# Patient Record
Sex: Male | Born: 1990 | Race: White | Hispanic: No | Marital: Single | Smoking: Current every day smoker
Health system: Southern US, Community
[De-identification: ages and names within clinical notes are randomized; demographics above are authoritative.]

## PROBLEM LIST (undated history)

## (undated) ENCOUNTER — Emergency Department (HOSPITAL_BASED_OUTPATIENT_CLINIC_OR_DEPARTMENT_OTHER): Admission: EM | Payer: 59

## (undated) HISTORY — PX: KNEE ARTHROSCOPY W/ ACL RECONSTRUCTION: SHX1858

---
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
## 2016-12-20 DIAGNOSIS — R0602 Shortness of breath: Secondary | ICD-10-CM | POA: Diagnosis not present

## 2016-12-20 DIAGNOSIS — J984 Other disorders of lung: Secondary | ICD-10-CM | POA: Diagnosis not present

## 2016-12-20 DIAGNOSIS — Z7689 Persons encountering health services in other specified circumstances: Secondary | ICD-10-CM | POA: Diagnosis not present

## 2016-12-20 DIAGNOSIS — R918 Other nonspecific abnormal finding of lung field: Secondary | ICD-10-CM | POA: Diagnosis not present

## 2016-12-22 DIAGNOSIS — J029 Acute pharyngitis, unspecified: Secondary | ICD-10-CM | POA: Diagnosis not present

## 2016-12-22 DIAGNOSIS — J351 Hypertrophy of tonsils: Secondary | ICD-10-CM | POA: Diagnosis not present

## 2016-12-24 DIAGNOSIS — J3501 Chronic tonsillitis: Secondary | ICD-10-CM | POA: Diagnosis not present

## 2017-04-09 DIAGNOSIS — J029 Acute pharyngitis, unspecified: Secondary | ICD-10-CM | POA: Diagnosis not present

## 2017-04-23 ENCOUNTER — Encounter: Payer: Self-pay | Admitting: Emergency Medicine

## 2017-04-23 ENCOUNTER — Emergency Department
Admission: EM | Admit: 2017-04-23 | Discharge: 2017-04-23 | Disposition: A | Discharge status: Home / Self Care | Payer: Commercial Managed Care - HMO | Attending: Emergency Medicine | Admitting: Emergency Medicine

## 2017-04-23 ENCOUNTER — Emergency Department: Payer: Commercial Managed Care - HMO

## 2017-04-23 DIAGNOSIS — Z87891 Personal history of nicotine dependence: Secondary | ICD-10-CM | POA: Diagnosis not present

## 2017-04-23 DIAGNOSIS — R55 Syncope and collapse: Secondary | ICD-10-CM | POA: Diagnosis not present

## 2017-04-23 DIAGNOSIS — R42 Dizziness and giddiness: Secondary | ICD-10-CM | POA: Diagnosis not present

## 2017-04-23 DIAGNOSIS — R0602 Shortness of breath: Secondary | ICD-10-CM | POA: Diagnosis not present

## 2017-04-23 DIAGNOSIS — J019 Acute sinusitis, unspecified: Secondary | ICD-10-CM | POA: Diagnosis not present

## 2017-04-23 LAB — BASIC METABOLIC PANEL
ANION GAP: 7 (ref 5–15)
BUN: 13 mg/dL (ref 6–20)
CHLORIDE: 102 mmol/L (ref 101–111)
CO2: 29 mmol/L (ref 22–32)
Calcium: 9.1 mg/dL (ref 8.9–10.3)
Creatinine, Ser: 0.97 mg/dL (ref 0.61–1.24)
GFR calc non Af Amer: >60 mL/min (ref >60)
GLUCOSE: 110 mg/dL — AB (ref 65–99)
POTASSIUM: 3.3 mmol/L — AB (ref 3.5–5.1)
Sodium: 138 mmol/L (ref 135–145)

## 2017-04-23 LAB — TROPONIN I: Troponin I: <0.03 ng/mL (ref <0.03)

## 2017-04-23 LAB — CBC
HEMATOCRIT: 38.7 % — AB (ref 40.0–52.0)
HEMOGLOBIN: 13.8 g/dL (ref 13.0–18.0)
MCH: 27.8 pg (ref 26.0–34.0)
MCHC: 35.8 g/dL (ref 32.0–36.0)
MCV: 77.7 fL — AB (ref 80.0–100.0)
Platelets: 296 10*3/uL (ref 150–440)
RBC: 4.98 MIL/uL (ref 4.40–5.90)
RDW: 12.9 % (ref 11.5–14.5)
WBC: 10.6 10*3/uL (ref 3.8–10.6)

## 2017-04-23 LAB — CK: Total CK: 114 U/L (ref 49–397)

## 2017-04-23 MED ORDER — IOPAMIDOL (ISOVUE-370) INJECTION 76%
75.0000 mL | Freq: Once | INTRAVENOUS | Status: AC | PRN
  Administered 2017-04-23: 75 mL via INTRAVENOUS

## 2017-04-23 MED ORDER — AZITHROMYCIN 500 MG PO TABS
500.0000 mg | ORAL_TABLET | Freq: Once | ORAL | Status: AC
  Administered 2017-04-23: 500 mg via ORAL
  Filled 2017-04-23: qty 1

## 2017-04-23 MED ORDER — SODIUM CHLORIDE 0.9 % IV BOLUS (SEPSIS)
1000.0000 mL | Freq: Once | INTRAVENOUS | Status: AC
  Administered 2017-04-23: 1000 mL via INTRAVENOUS

## 2017-04-23 MED ORDER — AZITHROMYCIN 250 MG PO TABS: 250.0000 mg | ORAL_TABLET | Freq: Every day | ORAL | 0 refills | Status: AC

## 2017-04-23 NOTE — ED Triage Notes (Signed)
Pt presents to ED with c/o having a near syncopal episode while driving home just prior to arrival. Pt states he had a sudden onset of feeling dizzy and felt like his hands were going numb with sob. Pt currently talkative and answering questions without difficulty. Pt has no increased work of breathing noted at this time. denies any change in his eating habits and states he works outside but drank plenty of water today. Pt reports he stopped drinking red bull canned drinks about a month ago due to having chest pain in the center of his chest after drinking them. That pain has continued and increases when "breathing".

## 2017-04-23 NOTE — ED Notes (Signed)
Patient transported to CT 

## 2017-04-23 NOTE — ED Provider Notes (Signed)
[facility] Emergency Department Provider Note   ____________________________________________   First MD Initiated Contact with Patient 04/23/17 [id number]     (approximate)  I have reviewed the triage vital signs and the nursing notes.   HISTORY  Chief Complaint Dizziness and Near Syncope    HPI Albert Ferguson is a 26 y.o. male who presents to the ED with a chief complaint of dizziness. Patient was driving to his girlfriend's house when he turned to look to the left. Upon turning his head back towards the road, patient experienced sudden-onsetdizziness like the room was spinning. Symptoms associated with right upper extremity numbness and tingling. Felt like his right hand was clumsy. Denies associated headache, vision changes, slurred speech, altered mentation. 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 Denies recent fever, chills, chest pain, shortness of breath, abdominal pain, nausea, vomiting. Denies recent travel. Without intervention, symptoms have completely resolved.   Past medical history None  There are no active problems to display for this patient.   Past Surgical History:  Procedure Laterality Date  . KNEE ARTHROSCOPY W/ ACL RECONSTRUCTION      Prior to Admission medications   Not on File    Allergies Patient has no known allergies.  No family history on file.  Social History Social History  Substance Use Topics  . Smoking status: Former Games developer  . Smokeless tobacco: Never Used  . Alcohol use No    Review of Systems  Constitutional: No fever/chills. Eyes: No visual changes. ENT: No sore throat. Cardiovascular: Denies chest pain. Respiratory: Denies shortness of breath. Gastrointestinal: No abdominal pain.  No nausea, no vomiting.  No diarrhea.  No constipation. Genitourinary: Negative for dysuria. Musculoskeletal: Negative for back pain. Skin: Negative for  rash. Neurological: Negative for headaches or focal weakness. positive for dizziness and right upper extremity numbness/tingling.   ____________________________________________   PHYSICAL EXAM:  VITAL SIGNS: ED Triage Vitals  Enc Vitals Group     BP 04/23/17 0028 137/85     Pulse Rate 04/23/17 0028 74     Resp 04/23/17 0028 18     Temp 04/23/17 0028 98.4 F (36.9 C)     Temp Source 04/23/17 0028 Oral     SpO2 04/23/17 0028 99 %     Weight 04/23/17 0029 156 lb (70.8 kg)     Height 04/23/17 0029  (1.778 m)     Head Circumference --      Peak Flow --      Pain Score 04/23/17 0027 2     Pain Loc --      Pain Edu? --      Excl. in GC? --     Constitutional: Alert and oriented. Well appearing and in no acute distress. Eyes: Conjunctivae are normal. PERRL. EOMI. Head: Atraumatic. Nose: No congestion/rhinnorhea. Mouth/Throat: Mucous membranes are moist.  Oropharynx non-erythematous. Neck: No stridor.  No carotid bruits. Cardiovascular: Normal rate, regular rhythm. Grossly normal heart sounds.  Good peripheral circulation. Respiratory: Normal respiratory effort.  No retractions. Lungs CTAB. Gastrointestinal: Soft and nontender. No distention. No abdominal bruits. No CVA tenderness. Musculoskeletal: No lower extremity tenderness nor edema.  No joint effusions. Neurologic:  Alert and oriented 3. CN II-XII grossly intact. Normal speech and language. No gross focal neurologic deficits are appreciated. No gait instability. Skin:  Skin is warm, dry and intact. No rash noted. Psychiatric: Mood and affect are normal. Speech and behavior are normal.  ____________________________________________   LABS (all labs ordered are listed, but only abnormal results are displayed)  Labs Reviewed  BASIC METABOLIC PANEL - Abnormal; Notable for the following:       Result Value   Potassium 3.3 (*)    Glucose, Bld 110 (*)    All other components within normal limits  CBC - Abnormal;  Notable for the following:    HCT 38.7 (*)    MCV 77.7 (*)    All other components within normal limits  TROPONIN I  TROPONIN I  CK   ____________________________________________  EKG  ED ECG REPORT I, Ceferino Lang J, the attending physician, personally viewed and interpreted this ECG.   Date: 04/23/2017  EKG Time: 0028  Rate: 72  Rhythm: normal EKG, normal sinus rhythm  Axis: RAD  Intervals:none  ST&T Change: nonspecific  ____________________________________________  RADIOLOGY  Ct Angio Head W/cm &/or Wo Cm  Result Date: 04/23/2017 CLINICAL DATA:  Near syncopal episode EXAM: CT ANGIOGRAPHY HEAD AND NECK TECHNIQUE: Multidetector CT imaging of the head and neck was performed using the standard protocol during bolus administration of intravenous contrast. Multiplanar CT image reconstructions and MIPs were obtained to evaluate the vascular anatomy. Carotid stenosis measurements (when applicable) are obtained utilizing NASCET criteria, using the distal internal carotid diameter as the denominator. CONTRAST:  75 mL Isovue 370 COMPARISON:  None. FINDINGS: CT HEAD FINDINGS Brain: No mass lesion, intraparenchymal hemorrhage or extra-axial collection. No evidence of acute cortical infarct. Brain parenchyma and CSF-containing spaces are normal for age. Vascular: No hyperdense vessel or unexpected calcification. Skull: Normal visualized skull base, calvarium and extracranial soft tissues. Sinuses/Orbits: Moderate maxillary and ethmoid sinus mucosal thickening. Normal orbits. CTA NECK FINDINGS Aortic arch: There is no aneurysm or dissection of the visualized ascending aorta or aortic arch. Normal 3 vessel aortic branching pattern. The visualized proximal subclavian arteries are normal. Right carotid system: The right common carotid origin is widely patent. There is no common carotid or internal carotid artery dissection or aneurysm. No hemodynamically significant stenosis. Left carotid system: The left  common carotid origin is widely patent. There is no common carotid or internal carotid artery dissection or aneurysm. No hemodynamically significant stenosis. Vertebral arteries: The vertebral system is left dominant. Both vertebral artery origins are normal. Both vertebral arteries are normal to their confluence with the basilar artery. Skeleton: There is no bony spinal canal stenosis. No lytic or blastic lesions. Other neck: The nasopharynx is clear. The oropharynx and hypopharynx are normal. The epiglottis is normal. The supraglottic larynx, glottis and subglottic larynx are normal. No retropharyngeal collection. The parapharyngeal spaces are preserved. The parotid and submandibular glands are normal. No sialolithiasis or salivary ductal dilatation. The thyroid gland is normal. There is no cervical lymphadenopathy. Upper chest: No pneumothorax or pleural effusion. No nodules or masses. Review of the MIP images confirms the above findings CTA HEAD FINDINGS Anterior circulation: --Intracranial internal carotid arteries: Normal. --Anterior cerebral arteries: Normal. --Middle cerebral arteries: Normal. --Posterior communicating arteries: Absent bilaterally. Posterior circulation: --Posterior cerebral arteries: Normal. --Superior cerebellar arteries: Normal. --Basilar artery: Normal. --Anterior inferior cerebellar arteries: Normal. --Posterior inferior cerebellar arteries: Normal. Venous sinuses: As permitted by contrast timing, patent. Anatomic variants: None Delayed phase: No parenchymal contrast enhancement. Review of the MIP images confirms the above findings IMPRESSION: 1. Normal CTA of the head and neck. 2. Moderate bilateral ethmoid and maxillary sinus mucosal thickening. Electronically Signed   By: Deatra Robinson M.D.   On: 04/23/2017 04:52   Dg Chest 2 View  Result Date: 04/23/2017 CLINICAL DATA:  Sudden onset dizziness. Shortness of breath. Hands going numb. Previous smoker. EXAM: CHEST  2 VIEW  COMPARISON:  None. FINDINGS: Mild hyperinflation. The heart size and mediastinal contours are within normal limits. Both lungs are clear. The visualized skeletal structures are unremarkable. IMPRESSION: No active cardiopulmonary disease. Electronically Signed   By: Burman Nieves M.D.   On: 04/23/2017 00:57   Ct Angio Neck W And/or Wo Contrast  Result Date: 04/23/2017 CLINICAL DATA:  Near syncopal episode EXAM: CT ANGIOGRAPHY HEAD AND NECK TECHNIQUE: Multidetector CT imaging of the head and neck was performed using the standard protocol during bolus administration of intravenous contrast. Multiplanar CT image reconstructions and MIPs were obtained to evaluate the vascular anatomy. Carotid stenosis measurements (when applicable) are obtained utilizing NASCET criteria, using the distal internal carotid diameter as the denominator. CONTRAST:  75 mL Isovue 370 COMPARISON:  None. FINDINGS: CT HEAD FINDINGS Brain: No mass lesion, intraparenchymal hemorrhage or extra-axial collection. No evidence of acute cortical infarct. Brain parenchyma and CSF-containing spaces are normal for age. Vascular: No hyperdense vessel or unexpected calcification. Skull: Normal visualized skull base, calvarium and extracranial soft tissues. Sinuses/Orbits: Moderate maxillary and ethmoid sinus mucosal thickening. Normal orbits. CTA NECK FINDINGS Aortic arch: There is no aneurysm or dissection of the visualized ascending aorta or aortic arch. Normal 3 vessel aortic branching pattern. The visualized proximal subclavian arteries are normal. Right carotid system: The right common carotid origin is widely patent. There is no common carotid or internal carotid artery dissection or aneurysm. No hemodynamically significant stenosis. Left carotid system: The left common carotid origin is widely patent. There is no common carotid or internal carotid artery dissection or aneurysm. No hemodynamically significant stenosis. Vertebral arteries: The  vertebral system is left dominant. Both vertebral artery origins are normal. Both vertebral arteries are normal to their confluence with the basilar artery. Skeleton: There is no bony spinal canal stenosis. No lytic or blastic lesions. Other neck: The nasopharynx is clear. The oropharynx and hypopharynx are normal. The epiglottis is normal. The supraglottic larynx, glottis and subglottic larynx are normal. No retropharyngeal collection. The parapharyngeal spaces are preserved. The parotid and submandibular glands are normal. No sialolithiasis or salivary ductal dilatation. The thyroid gland is normal. There is no cervical lymphadenopathy. Upper chest: No pneumothorax or pleural effusion. No nodules or masses. Review of the MIP images confirms the above findings CTA HEAD FINDINGS Anterior circulation: --Intracranial internal carotid arteries: Normal. --Anterior cerebral arteries: Normal. --Middle cerebral arteries: Normal. --Posterior communicating arteries: Absent bilaterally. Posterior circulation: --Posterior cerebral arteries: Normal. --Superior cerebellar arteries: Normal. --Basilar artery: Normal. --Anterior inferior cerebellar arteries: Normal. --Posterior inferior cerebellar arteries: Normal. Venous sinuses: As permitted by contrast timing, patent. Anatomic variants: None Delayed phase: No parenchymal contrast enhancement. Review of the MIP images confirms the above findings IMPRESSION: 1. Normal CTA of the head and neck. 2. Moderate bilateral ethmoid and maxillary sinus mucosal thickening. Electronically Signed   By: Deatra Robinson M.D.   On: 04/23/2017 04:52    ____________________________________________   PROCEDURES  Procedure(s) performed: None   NIH Stroke Scale  Interval: Current assessment Time: 5:45 AM Person Administering Scale: Reed Dady J  Administer stroke scale items in the order listed. Record performance in each category after each subscale exam. Do not go back and change  scores. Follow directions provided for each exam technique. Scores should reflect what the patient does, not what the clinician thinks the patient can do. The clinician should record answers while administering  the exam and work quickly. Except where indicated, the patient should not be coached (i.e., repeated requests to patient to make a special effort).   1a  Level of consciousness: 0=alert; keenly responsive  1b. LOC questions:  0=Performs both tasks correctly  1c. LOC commands: 0=Performs both tasks correctly  2.  Best Gaze: 0=normal  3.  Visual: 0=No visual loss  4. Facial Palsy: 0=Normal symmetric movement  5a.  Motor left arm: 0=No drift, limb holds 90 (or 45) degrees for full 10 seconds  5b.  Motor right arm: 0=No drift, limb holds 90 (or 45) degrees for full 10 seconds  6a. motor left leg: 0=No drift, limb holds 90 (or 45) degrees for full 10 seconds  6b  Motor right leg:  0=No drift, limb holds 90 (or 45) degrees for full 10 seconds  7. Limb Ataxia: 0=Absent  8.  Sensory: 0=Normal; no sensory loss  9. Best Language:  0=No aphasia, normal  10. Dysarthria: 0=Normal  11. Extinction and Inattention: 0=No abnormality  12. Distal motor function: 0=Normal   Total:   0    Procedures  Critical Care performed: No  ____________________________________________   INITIAL IMPRESSION / ASSESSMENT AND PLAN / ED COURSE  Pertinent labs & imaging results that were available during my care of the patient were reviewed by me and considered in my medical decision making (see chart for details).  26 year old otherwise healthy male who presents with sudden onset dizziness with RUE numbness/tingling. NIH scale = 0. Differential diagnosis includes TIA, CVA, carotid dissection, acute coronary event. Initial troponin unremarkable. 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 Will obtain CTA head and neck to evaluate for vascular abnormality.  Clinical Course as of  Apr 23 545  Thu Apr 23, 2017  0543 Updated patient and mother of CT imaging results as well as CK. Patient is resting in no acute distress. Symptoms have not returned. Queried patient regarding recent or active sinus infection. Patient states he has been experiencing sinus pressure and congestion for the past month, worse this week. Will place on short course of azithromycin. Strict return precautions given. Both verbalize understanding and agree with plan of care.  [JS]    Clinical Course User Index [JS] Irean Hong, MD     ____________________________________________   FINAL CLINICAL IMPRESSION(S) / ED DIAGNOSES  Final diagnoses:  Dizziness  Acute sinusitis, recurrence not specified, unspecified location      NEW MEDICATIONS STARTED DURING THIS VISIT:  New Prescriptions   No medications on file     Note:  This document was prepared using Dragon voice recognition software and may include unintentional dictation errors.    Irean Hong, MD 04/23/17 [phone]

## 2017-04-23 NOTE — Discharge Instructions (Signed)
1. Finish antibiotic as prescribed (azithromycin 250 mg daily 4 days). Start your next dose Friday. 2. Return to the ER for worsening symptoms, persistent vomiting, difficulty breathing or other concerns.

## 2017-04-23 NOTE — ED Notes (Signed)

## 2017-05-07 DIAGNOSIS — Z87891 Personal history of nicotine dependence: Secondary | ICD-10-CM | POA: Diagnosis not present

## 2017-05-07 DIAGNOSIS — H8113 Benign paroxysmal vertigo, bilateral: Secondary | ICD-10-CM | POA: Diagnosis not present

## 2017-05-07 DIAGNOSIS — Z9109 Other allergy status, other than to drugs and biological substances: Secondary | ICD-10-CM | POA: Diagnosis not present

## 2018-04-23 ENCOUNTER — Ambulatory Visit (INDEPENDENT_AMBULATORY_CARE_PROVIDER_SITE_OTHER): Payer: 59 | Admitting: Orthopaedic Surgery

## 2018-04-23 ENCOUNTER — Ambulatory Visit (INDEPENDENT_AMBULATORY_CARE_PROVIDER_SITE_OTHER): Payer: 59

## 2018-04-23 ENCOUNTER — Encounter (INDEPENDENT_AMBULATORY_CARE_PROVIDER_SITE_OTHER): Payer: Self-pay | Admitting: Orthopaedic Surgery

## 2018-04-23 VITALS — BP 140/80 | HR 78 | Ht 71.0 in | Wt 175.0 lb

## 2018-04-23 DIAGNOSIS — M542 Cervicalgia: Secondary | ICD-10-CM

## 2018-04-23 DIAGNOSIS — M25511 Pain in right shoulder: Secondary | ICD-10-CM

## 2018-04-23 NOTE — Progress Notes (Signed)
Office Visit Note   Patient: Albert Ferguson           Date of Birth: [birth year]           MRN: [id number] Visit Date: 04/23/2018              Requested by: [facility] [address] [place], [place] [address] PCP: [facility]   Assessment & Plan: Visit Diagnoses:  1. Right shoulder pain, unspecified chronicity   2. Neck pain     Plan: Conservative treatment recommended.  He can use ice, heat, Aleve 2 p.o. twice daily PRN.  If he has persistent symptoms after 6 weeks he can return.  He is neurologically intact on exam.  Follow-Up Instructions: Return if symptoms worsen or fail to improve.   Orders:  Orders Placed This Encounter  Procedures  . XR Shoulder Right  . XR Cervical Spine 2 or 3 views   No orders of the defined types were placed in this encounter.     Procedures: No procedures performed   Clinical Data: No additional findings.   Subjective: Chief Complaint  Patient presents with  . 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  He states usually can let go of the drill but was not able to let go quick enough.  A jerked and rotated his shoulder and he had soreness in his shoulder some difficulty with range of motion.  Sundays is been better than others.  He denies numbness or tingling in his hand.  After couple weeks he just had a second episode similar problem 2 days ago and again had some pain in his right shoulder.  No gait disturbance.  Past history of ACL reconstruction right knee 2012 which is doing well.  Review of Systems positive for previous right knee ACL reconstruction 2012 otherwise negative.  14 point review of systems updated.  One ER visit for dizziness with negative CT scan and diagnosis was sinus infection  treated with antibiotics.   Objective: Vital Signs: BP 140/80   Pulse 78   Ht 5\' 11"  (1.803 m)   Wt 175 lb (79.4 kg)   BMI 24.41 kg/m   Physical Exam  Constitutional: He is oriented to person, place, and time. He appears well-developed and well-nourished.  HENT:  Head: Normocephalic and atraumatic.  Eyes: Pupils are equal, round, and reactive to light. EOM are normal.  Neck: No tracheal deviation present. No thyromegaly present.  Cardiovascular: Normal rate.  Pulmonary/Chest: Effort normal. He has no wheezes.  Abdominal: Soft. Bowel sounds are normal.  Neurological: He is alert and oriented to person, place, and time.  Skin: Skin is warm and dry. Capillary refill takes less than 2 seconds.  Psychiatric: He has a normal mood and affect. His behavior is normal. Judgment and thought content normal.    Ortho Exam patient has negative Spurling negative Lhermitte, upper extremity reflexes 1+ and symmetrical no isolated motor weakness.  Full active range of motion of the shoulder negative subluxation.  Negative drop arm test negative Neer negative Hawkins long of the biceps is stable.  No atrophy sensation hand is normal good grip.  Normal heel toe gait.  Specialty Comments:  No specialty comments available.  Imaging: No results found.   PMFS  History: There are no active problems to display for this patient.  No past medical history on file.  No family history on file.  Past Surgical History:  Procedure Laterality Date  . KNEE ARTHROSCOPY W/ ACL RECONSTRUCTION     Social History   Occupational History  . Not on file  Tobacco Use  . Smoking status: Former Games developermoker  . Smokeless tobacco: Never Used  Substance and Sexual Activity  . Alcohol use: No  . Drug use: No  . Sexual activity: Not on file

## 2019-08-29 ENCOUNTER — Emergency Department (HOSPITAL_COMMUNITY)
Admission: EM | Admit: 2019-08-29 | Discharge: 2019-08-29 | Disposition: A | Discharge status: Home / Self Care | Payer: Self-pay | Attending: Emergency Medicine | Admitting: Emergency Medicine

## 2019-08-29 ENCOUNTER — Other Ambulatory Visit: Payer: Self-pay

## 2019-08-29 ENCOUNTER — Emergency Department (HOSPITAL_COMMUNITY): Payer: Self-pay

## 2019-08-29 ENCOUNTER — Encounter (HOSPITAL_COMMUNITY): Payer: Self-pay | Admitting: Emergency Medicine

## 2019-08-29 DIAGNOSIS — Z87891 Personal history of nicotine dependence: Secondary | ICD-10-CM | POA: Insufficient documentation

## 2019-08-29 DIAGNOSIS — R569 Unspecified convulsions: Secondary | ICD-10-CM | POA: Insufficient documentation

## 2019-08-29 LAB — URINALYSIS, ROUTINE W REFLEX MICROSCOPIC
Bilirubin Urine: NEGATIVE
Glucose, UA: NEGATIVE mg/dL
Hgb urine dipstick: NEGATIVE
Ketones, ur: NEGATIVE mg/dL
Leukocytes,Ua: NEGATIVE
Nitrite: NEGATIVE
Protein, ur: NEGATIVE mg/dL
Specific Gravity, Urine: 1.016 (ref 1.005–1.030)
pH: 5 (ref 5.0–8.0)

## 2019-08-29 LAB — CBC WITH DIFFERENTIAL/PLATELET
Abs Immature Granulocytes: 0.08 10*3/uL — ABNORMAL HIGH (ref 0.00–0.07)
Basophils Absolute: 0.1 10*3/uL (ref 0.0–0.1)
Basophils Relative: 1 %
Eosinophils Absolute: 0.3 10*3/uL (ref 0.0–0.5)
Eosinophils Relative: 4 %
HCT: 46.2 % (ref 39.0–52.0)
Hemoglobin: 15.3 g/dL (ref 13.0–17.0)
Immature Granulocytes: 1 %
Lymphocytes Relative: 22 %
Lymphs Abs: 1.6 10*3/uL (ref 0.7–4.0)
MCH: 27.8 pg (ref 26.0–34.0)
MCHC: 33.1 g/dL (ref 30.0–36.0)
MCV: 84 fL (ref 80.0–100.0)
Monocytes Absolute: 0.6 10*3/uL (ref 0.1–1.0)
Monocytes Relative: 8 %
Neutro Abs: 4.7 10*3/uL (ref 1.7–7.7)
Neutrophils Relative %: 64 %
Platelets: 244 10*3/uL (ref 150–400)
RBC: 5.5 MIL/uL (ref 4.22–5.81)
RDW: 13.2 % (ref 11.5–15.5)
WBC: 7.2 10*3/uL (ref 4.0–10.5)
nRBC: 0 % (ref 0.0–0.2)

## 2019-08-29 LAB — RAPID URINE DRUG SCREEN, HOSP PERFORMED
Amphetamines: NOT DETECTED
Barbiturates: NOT DETECTED
Benzodiazepines: NOT DETECTED
Cocaine: NOT DETECTED
Opiates: NOT DETECTED
Tetrahydrocannabinol: NOT DETECTED

## 2019-08-29 LAB — BASIC METABOLIC PANEL
Anion gap: 10 (ref 5–15)
BUN: 14 mg/dL (ref 6–20)
CO2: 25 mmol/L (ref 22–32)
Calcium: 9 mg/dL (ref 8.9–10.3)
Chloride: 103 mmol/L (ref 98–111)
Creatinine, Ser: 1.05 mg/dL (ref 0.61–1.24)
GFR calc Af Amer: >60 mL/min (ref >60)
GFR calc non Af Amer: >60 mL/min (ref >60)
Glucose, Bld: 105 mg/dL — ABNORMAL HIGH (ref 70–99)
Potassium: 3.8 mmol/L (ref 3.5–5.1)
Sodium: 138 mmol/L (ref 135–145)

## 2019-08-29 LAB — ETHANOL: Alcohol, Ethyl (B): <10 mg/dL (ref <10)

## 2019-08-29 LAB — CBG MONITORING, ED: Glucose-Capillary: 101 mg/dL — ABNORMAL HIGH (ref 70–99)

## 2019-08-29 MED ORDER — SODIUM CHLORIDE 0.9 % IV BOLUS
1000.0000 mL | Freq: Once | INTRAVENOUS | Status: AC
  Administered 2019-08-29: 1000 mL via INTRAVENOUS

## 2019-08-29 NOTE — ED Triage Notes (Addendum)
Pt BIB [facility] from home C/O seizure. Girlfriend reports pt was shaking and foam at the mouth while sleeping  Onset: 5:30 am while sleeping  Duration: 5 minutes  Postictal 10 minutes   Denies HX of seizures. Pt A&Ox4. No injuries. No loss of continence. Pt is unable to recall event. Pt denies any recent illness/stress. States he is a everyday drinker.   [facility] VS BP 150/90 HR 90 NSR SpO2 100% RA CBG 118

## 2019-08-29 NOTE — Discharge Instructions (Signed)
Call [facility] for further evaluation of your suspected new onset seizure.  Your work up today is normal but you will need further testing which the neurology specialist can provide.  In the interim, avoid any activity that could cause injury to you or others if you were to have another seizure - this includes driving, climbing ladders, tub soaks (take a shower), swimming (without someone to watch you). You will need to be restricted from these activities for 6 months or until as advised by neurology.

## 2019-08-29 NOTE — ED Provider Notes (Signed)
[facility] EMERGENCY DEPARTMENT Provider Note   CSN: [id number] Arrival date & time: 08/29/19  6283     History Chief Complaint  Patient presents with  . Seizures    Albert Ferguson is a 29 y.o. male with a history of increased etoh use, reporting drinking on average a 6 pack of beer daily, drank a "little" more yesterday, was found by girlfriend around 5:30 am having a seizure described as shaking and foaming white saliva at the mouth.  To her knowledge he was sleeping when the event occurred.  He was post ictal for about 10 minutes.  He currently denies any symptoms including headache, confusion, pain or injury and had no urinary or fecal incontinence with this event.  Denies history of seizures. Denies drug abuse.   No tx prior to arrival.   HPI     History reviewed. No pertinent past medical history.  There are no problems to display for this patient.   Past Surgical History:  Procedure Laterality Date  . KNEE ARTHROSCOPY W/ ACL RECONSTRUCTION         History reviewed. No pertinent family history.  Social History   Tobacco Use  . Smoking status: Former Research scientist (life sciences)  . Smokeless tobacco: Never Used  Substance Use Topics  . Alcohol use: No  . Drug use: No    Home Medications Prior to Admission medications   Medication Sig Start Date End Date Taking? Authorizing Provider  azithromycin (ZITHROMAX) 250 MG tablet Take 1 tablet (250 mg total) by mouth daily. 04/23/17   Paulette Blanch, MD    Allergies    Patient has no known allergies.  Review of Systems   Review of Systems  Constitutional: Negative for chills and fever.  HENT: Negative for congestion and sore throat.   Eyes: Negative.   Respiratory: Negative for chest tightness and shortness of breath.   Cardiovascular: Negative for chest pain.  Gastrointestinal: Negative for abdominal pain and nausea.  Genitourinary: Negative.   Musculoskeletal: Negative for arthralgias, joint swelling and neck  pain.  Skin: Negative.  Negative for rash and wound.  Neurological: Positive for seizures. Negative for dizziness, weakness, light-headedness, numbness and headaches.  Psychiatric/Behavioral: Negative.     Physical Exam Updated Vital Signs BP 128/90 (BP Location: Right Arm)   Pulse 73   Temp 98.2 F (36.8 C) (Oral)   Resp 20   Ht 5' 10.5" (1.791 m)   Wt 76.2 kg   SpO2 98%   BMI 23.76 kg/m   Physical Exam Vitals and nursing note reviewed.  Constitutional:      Appearance: He is well-developed.  HENT:     Head: Normocephalic and atraumatic.  Eyes:     Pupils: Pupils are equal, round, and reactive to light.  Cardiovascular:     Rate and Rhythm: Normal rate.     Heart sounds: Normal heart sounds.  Pulmonary:     Effort: Pulmonary effort is normal.  Abdominal:     Palpations: Abdomen is soft.     Tenderness: There is no abdominal tenderness.  Musculoskeletal:        General: Normal range of motion.     Cervical back: Normal range of motion and neck supple.  Lymphadenopathy:     Cervical: No cervical adenopathy.  Skin:    General: Skin is warm and dry.     Findings: No rash.  Neurological:     Mental Status: He is alert and oriented to person, place, and time.  GCS: GCS eye subscore is 4. GCS verbal subscore is 5. GCS motor subscore is 6.     Cranial Nerves: Cranial nerves are intact.     Sensory: Sensation is intact. No sensory deficit.     Coordination: Finger-Nose-Finger Test normal. Rapid alternating movements normal.     Gait: Gait normal.     Comments: Normal heel-shin, normal rapid alternating movements. Cranial nerves III-XII intact.  No pronator drift.  Psychiatric:        Speech: Speech normal.        Behavior: Behavior normal.        Thought Content: Thought content normal.     ED Results / Procedures / Treatments   Labs (all labs ordered are listed, but only abnormal results are displayed) Labs Reviewed  CBC WITH DIFFERENTIAL/PLATELET - Abnormal;  Notable for the following components:      Result Value   Abs Immature Granulocytes 0.08 (*)    All other components within normal limits  BASIC METABOLIC PANEL - Abnormal; Notable for the following components:   Glucose, Bld 105 (*)    All other components within normal limits  CBG MONITORING, ED - Abnormal; Notable for the following components:   Glucose-Capillary 101 (*)    All other components within normal limits  RAPID URINE DRUG SCREEN, HOSP PERFORMED  ETHANOL  URINALYSIS, ROUTINE W REFLEX MICROSCOPIC    EKG EKG Interpretation  Date/Time:  Monday August 29 2019 06:50:59 EST Ventricular Rate:  84 PR Interval:    QRS Duration: 87 QT Interval:  353 QTC Calculation: 418 R Axis:   94 Text Interpretation: Sinus rhythm Consider right atrial enlargement Borderline right axis deviation ST elev, probable normal early repol pattern When compared to prior, t wave inversion in lead 3 now upright. No STEMI Confirmed by Theda Belfast ([id number]) on 08/29/2019 7:11:01 AM   Radiology CT Head Wo Contrast  Result Date: 08/29/2019 CLINICAL DATA:  Nontraumatic seizure EXAM: CT HEAD WITHOUT CONTRAST TECHNIQUE: Contiguous axial images were obtained from the base of the skull through the vertex without intravenous contrast. COMPARISON:  None. FINDINGS: Brain: Gray-white differentiation is maintained. No CT evidence of acute large territory infarct. No intraparenchymal or extra-axial mass or hemorrhage. Normal size and configuration of the ventricles and the basilar cisterns. No midline shift. Vascular: No hyperdense vessel or unexpected calcification. Skull: No displaced calvarial fracture. Sinuses/Orbits: Mild circumferential wall mucosal thickening involving the left frontal sinus with scattered opacification of the right anterior ethmoidal air cells. The remaining paranasal sinuses and mastoid air cells appear well aerated. No air-fluid levels. Other: Regional soft tissues appear normal. IMPRESSION: 1.  Negative noncontrast head CT. 2. Mild sinus disease as above.  No air-fluid levels. Electronically Signed   By: Simonne Come M.D.   On: 08/29/2019 07:27    Procedures Procedures (including critical care time)  Medications Ordered in ED Medications  sodium chloride 0.9 % bolus 1,000 mL (0 mLs Intravenous Stopped 08/29/19 0930)    ED Course  I have reviewed the triage vital signs and the nursing notes.  Pertinent labs & imaging results that were available during my care of the patient were reviewed by me and considered in my medical decision making (see chart for details).    MDM Rules/Calculators/A&P                      PT with apparent new onset seizure of unclear etiology.  He does drink EtOH daily, describing a sixpack of beer  daily and he did drink yesterday.  He denies history of withdrawal symptoms.  He has no tachycardia here, no shaking tremor, no physical evidence of withdrawal at this time.  Labs EKG and CT imaging are reviewed and normal.  Discussed patient's course with Dr. Wilford Corner with neurology who recommended follow-up as an outpatient within the next 2 to 4 weeks.  No institution of medications today.  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  Strict report return precautions were also discussed. Final Clinical Impression(s) / ED Diagnoses Final diagnoses:  Seizure (HCC)    Rx / DC Orders ED Discharge Orders         Ordered    Ambulatory referral to Neurology    Comments: An appointment is requested in approximately: 2 weeks   08/29/19 1000           Burgess Amor, Cordelia Poche 08/29/19 1039    Tegeler, Canary Brim, MD 08/29/19 2010

## 2019-08-29 NOTE — ED Notes (Signed)
Pt transported to CT ?

## 2019-09-20 ENCOUNTER — Encounter: Payer: Self-pay | Admitting: Neurology

## 2019-09-20 ENCOUNTER — Ambulatory Visit (INDEPENDENT_AMBULATORY_CARE_PROVIDER_SITE_OTHER): Payer: 59 | Admitting: Neurology

## 2019-09-20 ENCOUNTER — Other Ambulatory Visit: Payer: Self-pay

## 2019-09-20 VITALS — BP 127/83 | HR 71 | Temp 97.3°F | Ht 71.0 in | Wt 176.0 lb

## 2019-09-20 DIAGNOSIS — R569 Unspecified convulsions: Secondary | ICD-10-CM

## 2019-09-20 HISTORY — DX: Unspecified convulsions: R56.9

## 2019-09-20 NOTE — Progress Notes (Signed)
Reason for visit: New onset seizure  Referring physician: Rivereno  Albert Ferguson is a 29 y.o. male  History of present illness:  Albert Ferguson is a 29 year old right-handed white male with a history of new onset seizure that occurred on 29 August 2019.  The patient indicates that he was drinking heavily around the time of the seizure, he has been consuming at least 12 beers daily for a month prior to the seizure.  The patient had gone to bed that evening, he apparently had a seizure that was witnessed by his girlfriend, occurring out of sleep.  He was noted to stiffen and then started jerking, he was foaming at the mouth.  He did not bite his tongue or lose control of the bowels or the bladder.  He was taken to the emergency room and a CT scan of the brain was done and was unremarkable.  The alcohol level was 0 and a urine drug screen was unremarkable.  The patient has never had a seizure before.  He denies any prior history of significant head trauma, there is no family history of seizures.  He reports no focal numbness or weakness of the face, arms, or legs.  He denies headaches or dizziness or vision changes.  He denies any balance issues or difficulty controlling the bowels or the bladder.  He has not had any recurrence of the seizure since the emergency room visit.  He has stopped drinking alcohol at this point.  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  History reviewed. No pertinent past medical history.  Past Surgical History:  Procedure Laterality Date  . KNEE ARTHROSCOPY W/ ACL RECONSTRUCTION      History reviewed. No pertinent family history.  Social history:  reports that he has been smoking. He has been smoking about 0.25 packs per day. He has never used smokeless tobacco. He reports that he does not drink alcohol or use drugs.  Medications:  Prior to Admission medications   Medication Sig Start Date End Date Taking? Authorizing Provider  albuterol  (VENTOLIN HFA) 108 (90 Base) MCG/ACT inhaler Inhale into the lungs. 12/20/16   [provider]  azithromycin (ZITHROMAX) 250 MG tablet Take 1 tablet (250 mg total) by mouth daily. Patient not taking: Reported on 09/20/2019 04/23/17   Irean Hong, MD  cefdinir (OMNICEF) 250 MG/5ML suspension SMARTSIG:6 Milliliter(s) By Mouth Twice Daily 04/08/19   [provider]     No Known Allergies  ROS:  Out of a complete 14 system review of symptoms, the patient complains only of the following symptoms, and all other reviewed systems are negative.  Seizure  Blood pressure 127/83, pulse 71, temperature (!) 97.3 F (36.3 C), height 5\' 11"  (1.803 m), weight 176 lb (79.8 kg).  Physical Exam  General: The patient is alert and cooperative at the time of the examination.  Eyes: Pupils are equal, round, and reactive to light. Discs are flat bilaterally.  Neck: The neck is supple, no carotid bruits are noted.  Respiratory: The respiratory examination is clear.  Cardiovascular: The cardiovascular examination reveals a regular rate and rhythm, no obvious murmurs or rubs are noted.  Skin: Extremities are without significant edema.  Neurologic Exam  Mental status: The patient is alert and oriented x 3 at the time of the examination. The patient has apparent normal recent and remote memory, with an apparently normal attention span and concentration ability.  Cranial nerves: Facial symmetry is present. There is good  sensation of the face to pinprick and soft touch bilaterally. The strength of the facial muscles and the muscles to head turning and shoulder shrug are normal bilaterally. Speech is well enunciated, no aphasia or dysarthria is noted. Extraocular movements are full. Visual fields are full. The tongue is midline, and the patient has symmetric elevation of the soft palate. No obvious hearing deficits are noted.  Motor: The motor testing reveals 5 over 5 strength of all 4 extremities.  Good symmetric motor tone is noted throughout.  Sensory: Sensory testing is intact to pinprick, soft touch, vibration sensation, and position sense on all 4 extremities. No evidence of extinction is noted.  Coordination: Cerebellar testing reveals good finger-nose-finger and heel-to-shin bilaterally.  Gait and station: Gait is normal. Tandem gait is normal. Romberg is negative. No drift is seen.  Reflexes: Deep tendon reflexes are symmetric and normal bilaterally. Toes are downgoing bilaterally.   CT head 08/29/19:  IMPRESSION: 1. Negative noncontrast head CT. 2. Mild sinus disease as above.  No air-fluid levels.  * CT scan images were reviewed online. I agree with the written report.    Assessment/Plan:  1.  New onset seizure  2.  History of alcohol abuse  The patient claims that he has stopped drinking alcohol.  This likely was the source of the seizure.  The seizure is likely to be a symptomatic seizure not primary epilepsy.  The patient will be set up for MRI of the brain and he will have an EEG study.  He is not to operate a motor vehicle for 3 months following the seizure, if he does well at that time, he may return to normal activity.  He is not to work at [employer] or operate heavy equipment.  He will follow-up here in 3 months.  Albert Alexanders MD 09/20/2019 9:43 AM  [facility] [address] [place], [place] [address]  Phone [phone] Fax [phone]

## 2019-09-29 ENCOUNTER — Ambulatory Visit (INDEPENDENT_AMBULATORY_CARE_PROVIDER_SITE_OTHER): Payer: 59

## 2019-09-29 ENCOUNTER — Telehealth: Payer: Self-pay | Admitting: Neurology

## 2019-09-29 ENCOUNTER — Other Ambulatory Visit: Payer: Self-pay

## 2019-09-29 DIAGNOSIS — R569 Unspecified convulsions: Secondary | ICD-10-CM | POA: Diagnosis not present

## 2019-09-29 NOTE — Telephone Encounter (Signed)
[employer] order sent to GI. They will obtain the auth and reach out to the patient to schedule.  

## 2019-10-03 ENCOUNTER — Telehealth: Payer: Self-pay | Admitting: Neurology

## 2019-10-03 NOTE — Procedures (Signed)
    History:  Albert Ferguson is a 29 year old patient with a history of a seizure on 29 August 2019.  The patient had been drinking heavily prior to the onset of the seizure, drinking at least 12 beers daily.  The patient is being evaluated for the seizure event.  This is a routine EEG.  No skull defects are noted.  Medications include albuterol and Zithromax.  EEG classification: Normal awake and asleep  Description of the recording: The background rhythms of this recording consists of a fairly well modulated medium amplitude background activity of 10 Hz. As the record progresses, the patient initially is in the waking state, but appears to enter the early stage II sleep during the recording, with rudimentary sleep spindles and vertex sharp wave activity seen. During the wakeful state, photic stimulation is performed, and this results in a bilateral and symmetric photic driving response. Hyperventilation was also performed, and this results in a minimal buildup of the background rhythm activities without significant slowing seen. At no time during the recording does there appear to be evidence of spike or spike wave discharges or evidence of focal slowing. EKG monitor shows no evidence of cardiac rhythm abnormalities with a heart rate of 66.  Impression: This is a normal EEG recording in the waking and sleeping state. No evidence of ictal or interictal discharges were seen at any time during the recording.

## 2019-10-03 NOTE — Telephone Encounter (Signed)
I called patient.  EEG study was normal, MRI of the brain is pending.

## 2019-11-15 ENCOUNTER — Encounter (HOSPITAL_COMMUNITY): Payer: Self-pay | Admitting: Emergency Medicine

## 2019-11-15 ENCOUNTER — Emergency Department (HOSPITAL_COMMUNITY)
Admission: EM | Admit: 2019-11-15 | Discharge: 2019-11-15 | Disposition: A | Discharge status: Home / Self Care | Payer: 59 | Attending: Emergency Medicine | Admitting: Emergency Medicine

## 2019-11-15 ENCOUNTER — Emergency Department (HOSPITAL_COMMUNITY): Payer: 59

## 2019-11-15 DIAGNOSIS — F17210 Nicotine dependence, cigarettes, uncomplicated: Secondary | ICD-10-CM | POA: Diagnosis not present

## 2019-11-15 DIAGNOSIS — Y929 Unspecified place or not applicable: Secondary | ICD-10-CM | POA: Diagnosis not present

## 2019-11-15 DIAGNOSIS — Y939 Activity, unspecified: Secondary | ICD-10-CM | POA: Insufficient documentation

## 2019-11-15 DIAGNOSIS — W11XXXA Fall on and from ladder, initial encounter: Secondary | ICD-10-CM | POA: Diagnosis not present

## 2019-11-15 DIAGNOSIS — S61411A Laceration without foreign body of right hand, initial encounter: Secondary | ICD-10-CM | POA: Insufficient documentation

## 2019-11-15 DIAGNOSIS — Y999 Unspecified external cause status: Secondary | ICD-10-CM | POA: Diagnosis not present

## 2019-11-15 DIAGNOSIS — S6991XA Unspecified injury of right wrist, hand and finger(s), initial encounter: Secondary | ICD-10-CM | POA: Diagnosis present

## 2019-11-15 DIAGNOSIS — S61411D Laceration without foreign body of right hand, subsequent encounter: Secondary | ICD-10-CM

## 2019-11-15 MED ORDER — CEPHALEXIN 500 MG PO CAPS: 500.0000 mg | ORAL_CAPSULE | Freq: Two times a day (BID) | ORAL | 0 refills | Status: AC

## 2019-11-15 NOTE — ED Triage Notes (Signed)
Pt arrives to ED with right hand injury that occurred last night, pt states he fell off a 85ft ladder last night and had to [facility] and had an xray. Pt here today due to swelling in thumb and wrist. +2 radial pulses, movement and sensation intact.

## 2019-11-15 NOTE — ED Notes (Signed)
Pt left before receiving discharge instructions.

## 2019-11-15 NOTE — ED Notes (Signed)
Pt has pain and swelling to right wrist and base of right thumb.  Pt st's sutures were placed at Urgent Care and wrist was x-rayed but told it was neg.   Pt wanting 2'nd opinion

## 2019-11-15 NOTE — Discharge Instructions (Signed)
Keep the area clean and apply Neosporin to the area. Take the antibiotics as directed. Return to the ER if you start to experience worsening pain, swelling, drainage, redness or warmth.

## 2019-11-15 NOTE — ED Provider Notes (Signed)
[facility] EMERGENCY DEPARTMENT Provider Note   CSN: [id number] Arrival date & time: 11/15/19  1325     History Chief Complaint  Patient presents with   Hand Injury    Albert Ferguson is a 29 y.o. male who presents to ED with a chief complaint of right hand pain.  Last night approximately 24 hours ago patient was working when he fell off of a 6 foot ladder.  States that he believes he fell onto his outstretched right hand.  He sustained a laceration to the palm of his hand, went to urgent care last night.  States that x-rays were done but he is unaware of the results.  Laceration was sutured.  He woke up this morning with continued pain to the dorsum of the R hand near the thumb as well as swelling.  He has not taken any medications to help with the symptoms.  Denies any headache, vision changes, vomiting, numbness in arms or legs. Patient was not wearing a glove when the injury occurred.  HPI     Past Medical History:  Diagnosis Date   New onset seizure (HCC) 09/20/2019    Patient Active Problem List   Diagnosis Date Noted   New onset seizure (HCC) 09/20/2019    Past Surgical History:  Procedure Laterality Date   KNEE ARTHROSCOPY W/ ACL RECONSTRUCTION         Family History  Problem Relation Age of Onset   Healthy Mother    Hypertension Father    Healthy Sister    Healthy Brother    Seizures Neg Hx     Social History   Tobacco Use   Smoking status: Current Every Day Smoker    Packs/day: 0.25   Smokeless tobacco: Never Used  Substance Use Topics   Alcohol use: No   Drug use: No    Home Medications Prior to Admission medications   Medication Sig Start Date End Date Taking? Authorizing Provider  albuterol (VENTOLIN HFA) 108 (90 Base) MCG/ACT inhaler Inhale into the lungs. 12/20/16   [provider]  azithromycin (ZITHROMAX) 250 MG tablet Take 1 tablet (250 mg total) by mouth daily. Patient not taking: Reported on  09/20/2019 04/23/17   Irean Hong, MD  cefdinir (OMNICEF) 250 MG/5ML suspension SMARTSIG:6 Milliliter(s) By Mouth Twice Daily 04/08/19   [provider]  cephALEXin (KEFLEX) 500 MG capsule Take 1 capsule (500 mg total) by mouth 2 (two) times daily for 5 days. 11/15/19 11/20/19  Dietrich Pates, PA-C    Allergies    Patient has no known allergies.  Review of Systems   Review of Systems  Gastrointestinal: Negative for nausea and vomiting.  Skin: Positive for wound.  Neurological: Negative for weakness and numbness.    Physical Exam Updated Vital Signs BP 134/82 (BP Location: Left Arm)    Pulse 68    Temp 98.3 F (36.8 C) (Oral)    Resp 18    SpO2 98%   Physical Exam Vitals and nursing note reviewed.  Constitutional:      General: He is not in acute distress.    Appearance: He is well-developed. He is not diaphoretic.  HENT:     Head: Normocephalic and atraumatic.  Eyes:     General: No scleral icterus.    Conjunctiva/sclera: Conjunctivae normal.  Pulmonary:     Effort: Pulmonary effort is normal. No respiratory distress.  Musculoskeletal:       Hands:     Cervical back: Normal range  of motion.     Comments: TTP of the indicated area with normal ROM of digits. Pain with flexion and extension of the right wrist although able to perform.  Some swelling noted.  No overlying erythema, deformities or warmth of joint.  2+ radial pulse palpated bilaterally.  Skin:    Findings: No rash.  Neurological:     Mental Status: He is alert.     ED Results / Procedures / Treatments   Labs (all labs ordered are listed, but only abnormal results are displayed) Labs Reviewed - No data to display  EKG None  Radiology DG Wrist Complete Right  Result Date: 11/15/2019 CLINICAL DATA:  Posterior right wrist pain status post fall last night. EXAM: RIGHT WRIST - COMPLETE 3+ VIEW COMPARISON:  None. FINDINGS: There is no evidence of fracture or dislocation. There is no evidence of arthropathy  or other focal bone abnormality. Soft tissues are unremarkable. IMPRESSION: Negative. Electronically Signed   By: Elige Ko   On: 11/15/2019 16:34    Procedures Procedures (including critical care time)  Medications Ordered in ED Medications - No data to display  ED Course  I have reviewed the triage vital signs and the nursing notes.  Pertinent labs & imaging results that were available during my care of the patient were reviewed by me and considered in my medical decision making (see chart for details).    MDM Rules/Calculators/A&P                      29 year old male presenting to the ED with a dominant right hand pain after injury that occurred approximately 24 hours ago.  States that he fell from a 6 foot ladder while working yesterday and sustained a laceration to his right hand.  He went to urgent care, area was cleaned (although patient not completely satisfied) and x-rays were done.  He is unsure of the results of the x-rays.  On my exam there are 3 intact sutures noted to the palm of the right hand near the thumb.  He has some swelling which I feel could be from the injury itself.  There are no obvious signs of infection, no redness, purulent drainage, red streaks or warmth noted.  Area is neurovascularly intact.  There is some dried blood noted which patient will clean off.  He has normal range of motion of his digits, no signs of flexor tenosynovitis.  No deformities noted of the wrist although he has pain with certain movements of the wrist.  Unable to view x-rays from his visit yesterday so discussed that additional x-rays will need to be obtained today.  X-rays of the wrist today show no acute findings.  I feel that this could be more so related to the injury itself.  I did have a discussion with the patient if he is concerned for possible infection.  I do not feel that it is necessary to open up the wound as it appears to be healing and is about 24 hours from the time of injury.   Is comfortable with taking antibiotic course, continued NSAIDs and following up with PCP and hand specialist.  Patient is hemodynamically stable, in NAD, and able to ambulate in the ED. Evaluation does not show pathology that would require ongoing emergent intervention or inpatient treatment. I have personally reviewed and interpreted all lab work and imaging at today's ED visit. I explained the diagnosis to the patient. Pain has been managed and has no complaints  prior to discharge. Patient is comfortable with above plan and is stable for discharge at this time. All questions were answered prior to disposition. Strict return precautions for returning to the ED were discussed. Encouraged follow up with PCP.   An After Visit Summary was printed and given to the patient.   Portions of this note were generated with [employer]. Dictation errors may occur despite best attempts at proofreading.  Final Clinical Impression(s) / ED Diagnoses Final diagnoses:  Laceration of right hand without foreign body, subsequent encounter    Rx / DC Orders ED Discharge Orders         Ordered    cephALEXin (KEFLEX) 500 MG capsule  2 times daily     11/15/19 1702           Delia Heady, PA-C 11/15/19 1703    Charlesetta Shanks, MD 11/21/19 [phone]

## 2019-12-15 NOTE — Progress Notes (Deleted)
PATIENT: Albert Ferguson DOB: 1991/05/03  REASON FOR VISIT: follow up HISTORY FROM: patient  HISTORY OF PRESENT ILLNESS: Today 12/15/19  Albert Ferguson is a 29 year old male with history of new onset seizure on August 29, 2019.  He was drinking heavily around the time of the seizure.  EEG was normal.  MRI of the brain with and without contrast has yet to be completed.  HISTORY 09/20/2019 Dr. Anne Hahn: Albert Ferguson is a 29 year old right-handed white male with a history of new onset seizure that occurred on 29 August 2019.  The patient indicates that he was drinking heavily around the time of the seizure, he has been consuming at least 12 beers daily for a month prior to the seizure.  The patient had gone to bed that evening, he apparently had a seizure that was witnessed by his girlfriend, occurring out of sleep.  He was noted to stiffen and then started jerking, he was foaming at the mouth.  He did not bite his tongue or lose control of the bowels or the bladder.  He was taken to the emergency room and a CT scan of the brain was done and was unremarkable.  The alcohol level was 0 and a urine drug screen was unremarkable.  The patient has never had a seizure before.  He denies any prior history of significant head trauma, there is no family history of seizures.  He reports no focal numbness or weakness of the face, arms, or legs.  He denies headaches or dizziness or vision changes.  He denies any balance issues or difficulty controlling the bowels or the bladder.  He has not had any recurrence of the seizure since the emergency room visit.  He has stopped drinking alcohol at this point.  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  REVIEW OF SYSTEMS: Out of a complete 14 system review of symptoms, the patient complains only of the following symptoms, and all other reviewed systems are negative.  ALLERGIES: No Known Allergies  HOME MEDICATIONS: Outpatient Medications Prior to Visit    Medication Sig Dispense Refill  . albuterol (VENTOLIN HFA) 108 (90 Base) MCG/ACT inhaler Inhale into the lungs.    Marland Kitchen azithromycin (ZITHROMAX) 250 MG tablet Take 1 tablet (250 mg total) by mouth daily. (Patient not taking: Reported on 09/20/2019) 4 each 0  . cefdinir (OMNICEF) 250 MG/5ML suspension SMARTSIG:6 Milliliter(s) By Mouth Twice Daily     No facility-administered medications prior to visit.    PAST MEDICAL HISTORY: Past Medical History:  Diagnosis Date  . New onset seizure ([facility]) 09/20/2019    PAST SURGICAL HISTORY: Past Surgical History:  Procedure Laterality Date  . KNEE ARTHROSCOPY W/ ACL RECONSTRUCTION      FAMILY HISTORY: Family History  Problem Relation Age of Onset  . Healthy Mother   . Hypertension Father   . Healthy Sister   . Healthy Brother   . Seizures Neg Hx     SOCIAL HISTORY: Social History   Socioeconomic History  . Marital status: Single    Spouse name: Not on file  . Number of children: Not on file  . Years of education: Not on file  . Highest education level: Not on file  Occupational History  . Not on file  Tobacco Use  . Smoking status: Current Every Day Smoker    Packs/day: 0.25  . Smokeless tobacco: Never Used  Substance and Sexual Activity  . Alcohol use: No  . Drug use: No  .  Sexual activity: Not on file  Other Topics Concern  . Not on file  Social History Narrative  . Not on file   Social Determinants of Health   Financial Resource Strain:   . Difficulty of Paying Living Expenses:   Food Insecurity:   . Worried About Charity fundraiser in the Last Year:   . Arboriculturist in the Last Year:   Transportation Needs:   . Film/video editor (Medical):   Marland Kitchen Lack of Transportation (Non-Medical):   Physical Activity:   . Days of Exercise per Week:   . Minutes of Exercise per Session:   Stress:   . Feeling of Stress :   Social Connections:   . Frequency of Communication with Friends and Family:   . Frequency of Social  Gatherings with Friends and Family:   . Attends Religious Services:   . Active Member of Clubs or Organizations:   . Attends Archivist Meetings:   Marland Kitchen Marital Status:   Intimate Partner Violence:   . Fear of Current or Ex-Partner:   . Emotionally Abused:   Marland Kitchen Physically Abused:   . Sexually Abused:       PHYSICAL EXAM  There were no vitals filed for this visit. There is no height or weight on file to calculate BMI.  Generalized: Well developed, in no acute distress   Neurological examination  Mentation: Alert oriented to time, place, history taking. Follows all commands speech and language fluent Cranial nerve II-XII: Pupils were equal round reactive to light. Extraocular movements were full, visual field were full on confrontational test. Facial sensation and strength were normal. Uvula tongue midline. Head turning and shoulder shrug  were normal and symmetric. Motor: The motor testing reveals 5 over 5 strength of all 4 extremities. Good symmetric motor tone is noted throughout.  Sensory: Sensory testing is intact to soft touch on all 4 extremities. No evidence of extinction is noted.  Coordination: Cerebellar testing reveals good finger-nose-finger and heel-to-shin bilaterally.  Gait and station: Gait is normal. Tandem gait is normal. Romberg is negative. No drift is seen.  Reflexes: Deep tendon reflexes are symmetric and normal bilaterally.   DIAGNOSTIC DATA (LABS, IMAGING, TESTING) - I reviewed patient records, labs, notes, testing and imaging myself where available.  Lab Results  Component Value Date   WBC 7.2 08/29/2019   HGB 15.3 08/29/2019   HCT 46.2 08/29/2019   MCV 84.0 08/29/2019   PLT 244 08/29/2019      Component Value Date/Time   NA 138 08/29/2019 0654   K 3.8 08/29/2019 0654   CL 103 08/29/2019 0654   CO2 25 08/29/2019 0654   GLUCOSE 105 (H) 08/29/2019 0654   BUN 14 08/29/2019 0654   CREATININE 1.05 08/29/2019 0654   CALCIUM 9.0 08/29/2019 0654    GFRNONAA >60 08/29/2019 0654   GFRAA >60 08/29/2019 0654   No results found for: CHOL, HDL, LDLCALC, LDLDIRECT, TRIG, CHOLHDL No results found for: HGBA1C No results found for: VITAMINB12 No results found for: TSH    ASSESSMENT AND PLAN 29 y.o. year old male  has a past medical history of New onset seizure ([place]) (09/20/2019). here with ***   I spent 15 minutes with the patient. 50% of this time was spent   Butler [place], [place], DNP 12/15/2019, 3:25 PM [facility] [address] [place], [place] [address] [phone]

## 2019-12-19 ENCOUNTER — Ambulatory Visit: Payer: 59 | Admitting: Neurology

## 2019-12-19 ENCOUNTER — Encounter: Payer: Self-pay | Admitting: Neurology

## 2021-10-27 IMAGING — CT CT HEAD W/O CM
4 series · 16 of 47 positions shown, 18 images · non-contrast
Comparison: None.

CLINICAL DATA: Nontraumatic seizure

EXAM:
CT HEAD WITHOUT CONTRAST
TECHNIQUE: Contiguous axial images were obtained from the base of the skull
through the vertex without intravenous contrast.

[Series 3: head without · axial · non-contrast · 0.43mm/px · z∈[-45,+75]mm · 7 of 33 slices shown, 9 images]
[im 5/33  brain]
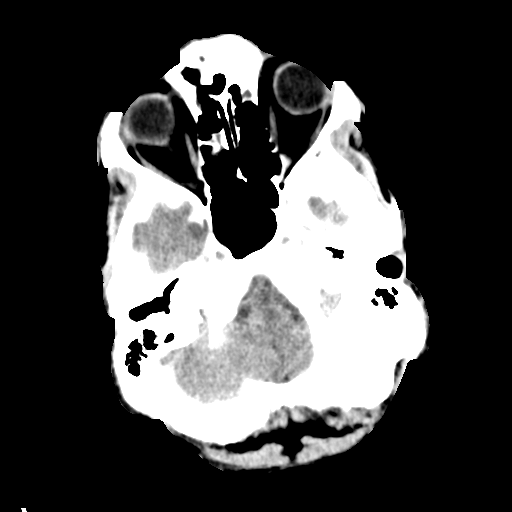
[im 5/33  bone]
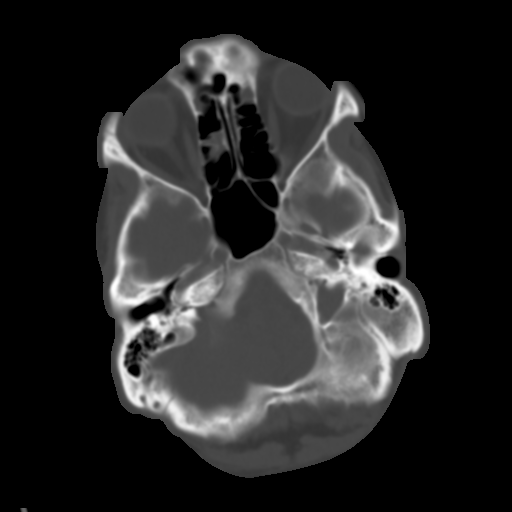
[im 9/33  brain]
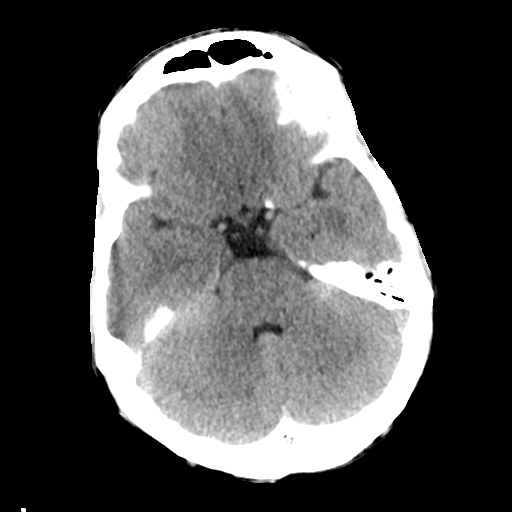
[im 13/33  brain]
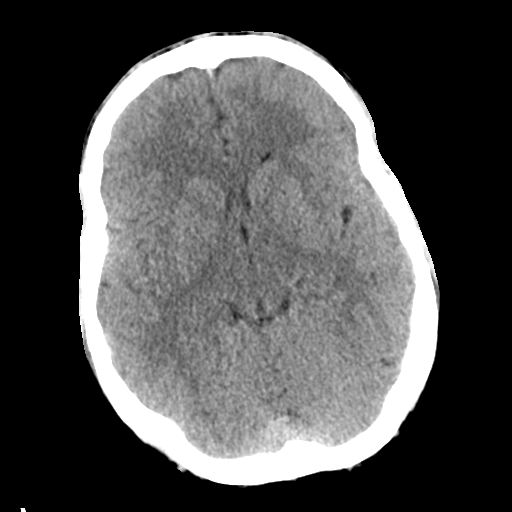
[im 17/33  brain]
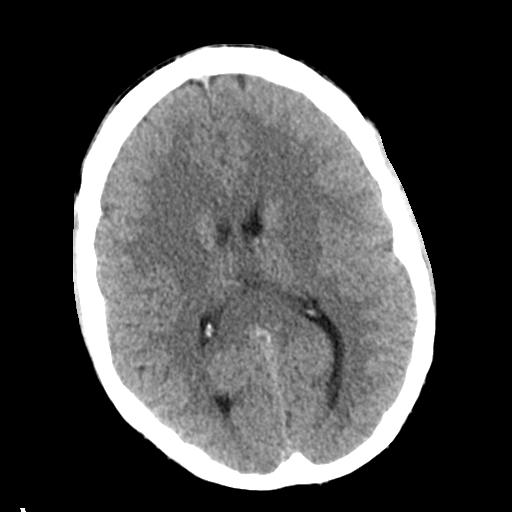
[im 21/33  brain]
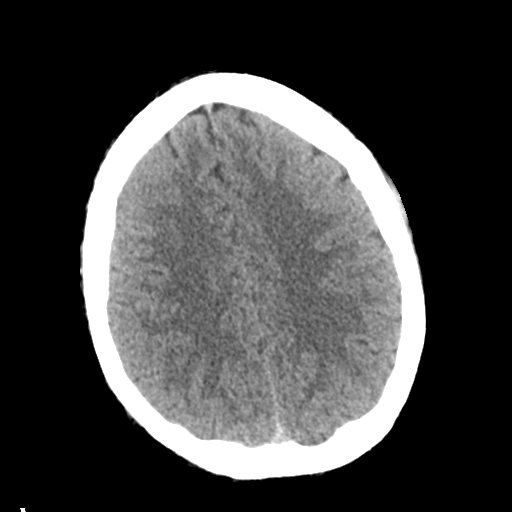
[im 21/33  bone]
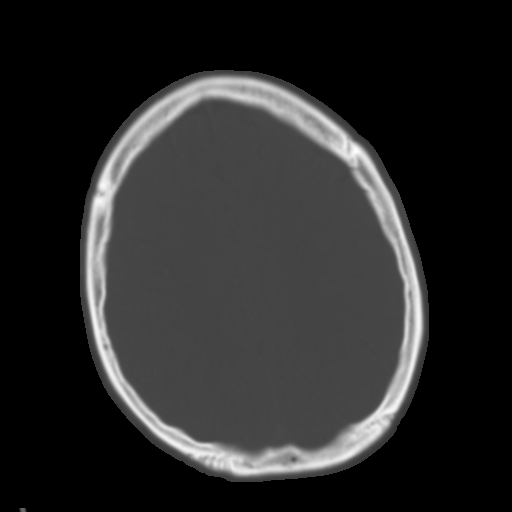
[im 25/33  brain]
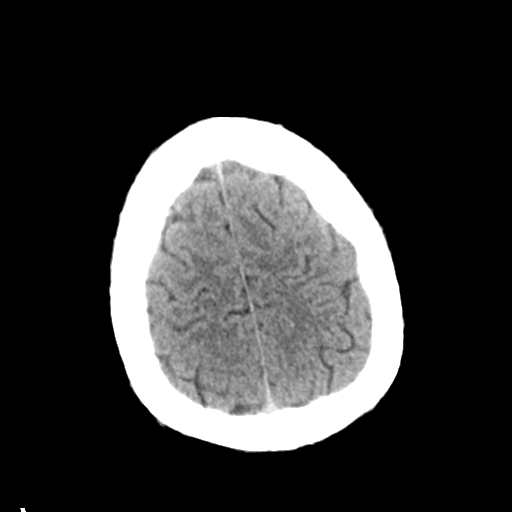
[im 29/33  brain]
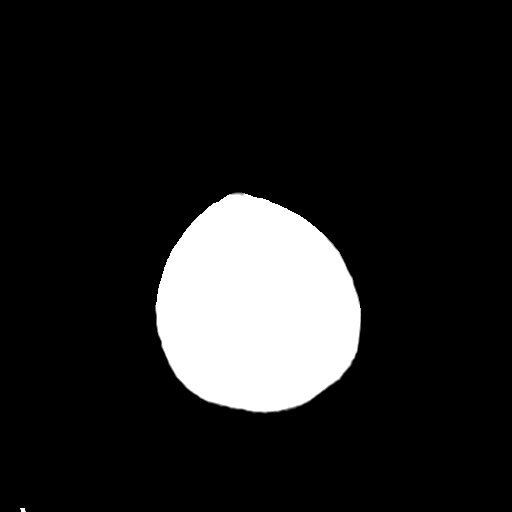

[Series 4: head bone · axial · 0.43mm/px · z∈[-49,-17]mm · 3 of 83 slices shown]
[im 9/83  bone]
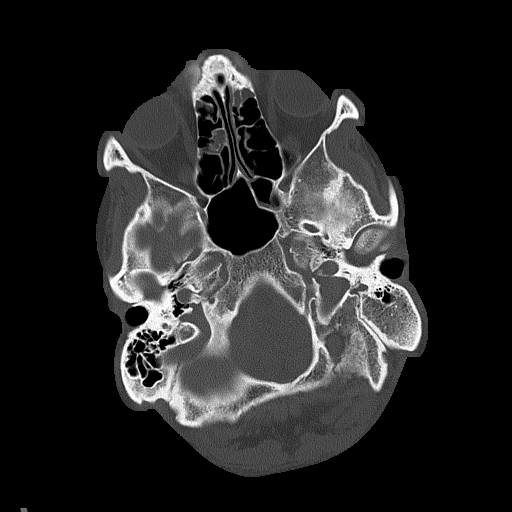
[im 17/83  bone]
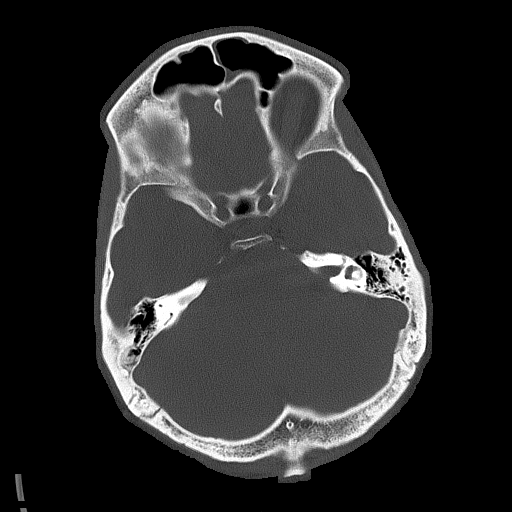
[im 25/83  bone]
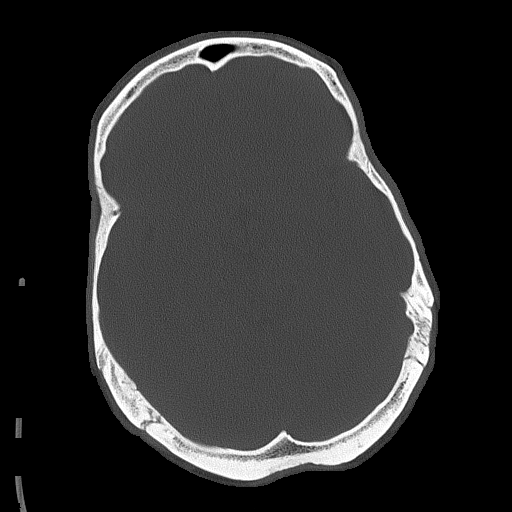

[Series 5: head without cor · coronal · non-contrast · 0.30mm/px · 3 of 71 slices shown]
[im 24/71  brain]
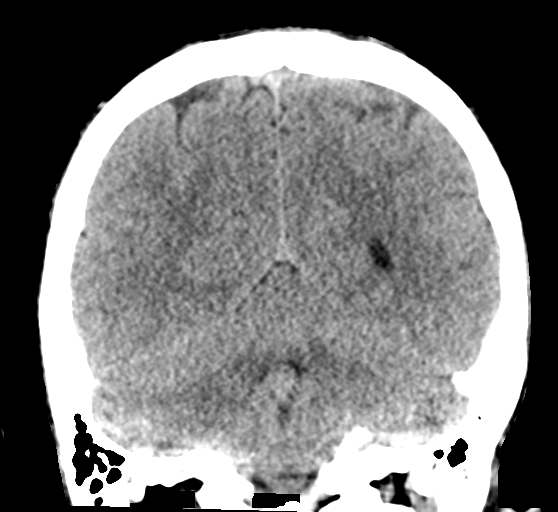
[im 32/71  brain]
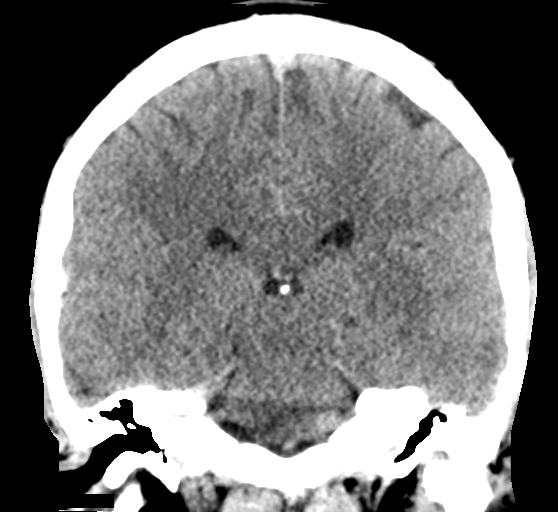
[im 39/71  brain]
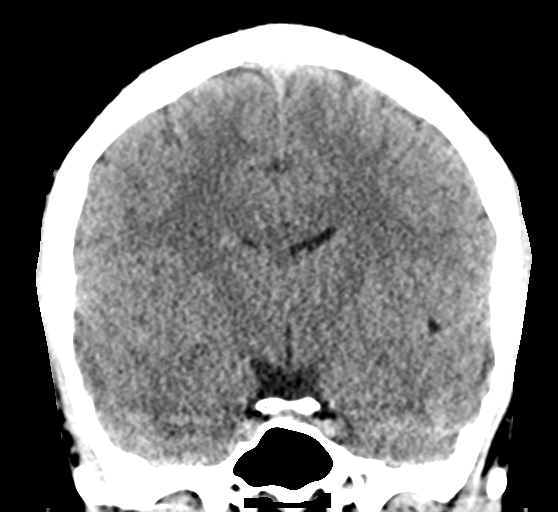

[Series 6: head without sag · sagittal · non-contrast · 0.30mm/px · 3 of 57 slices shown]
[im 19/57  brain]
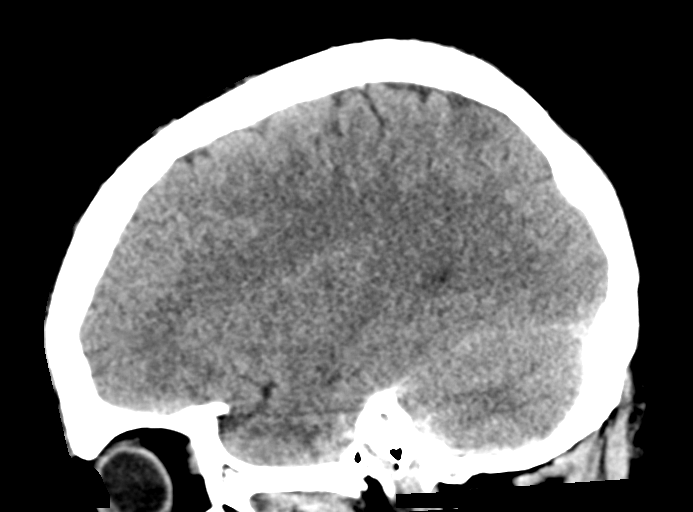
[im 29/57  brain]
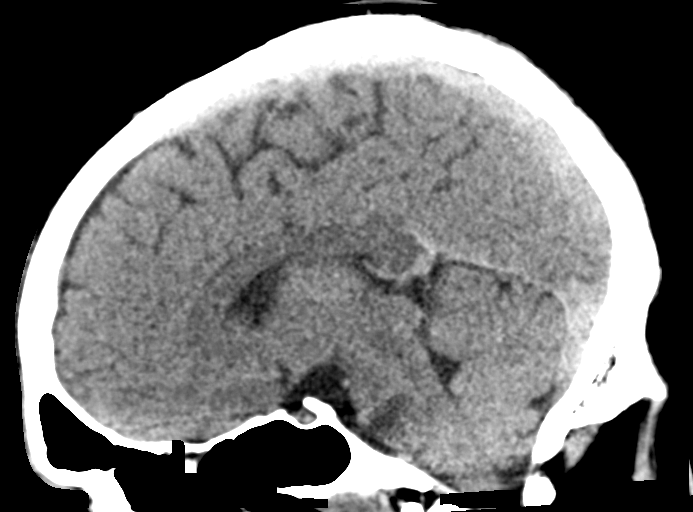
[im 38/57  brain]
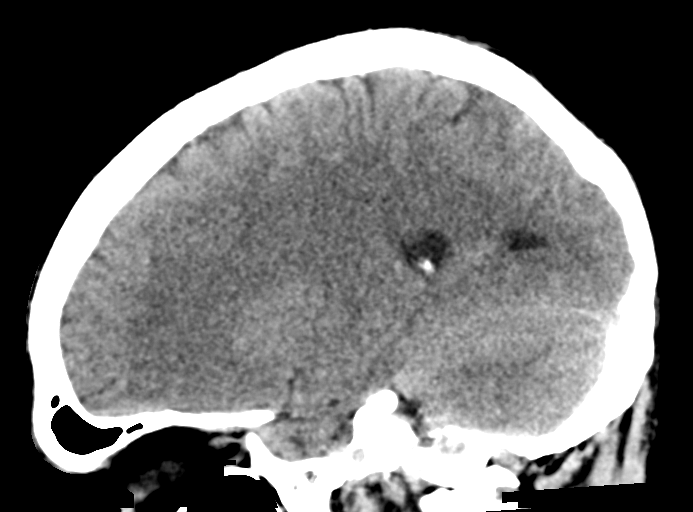

[16 of 47 positions shown; findings below may reference images not displayed]

FINDINGS: Brain: Gray-white differentiation is maintained. No CT evidence of
acute large territory infarct. No intraparenchymal or extra-axial
mass or hemorrhage. Normal size and configuration of the ventricles
and the basilar cisterns. No midline shift.

Vascular: No hyperdense vessel or unexpected calcification.

Skull: No displaced calvarial fracture.

Sinuses/Orbits: Mild circumferential wall mucosal thickening
involving the left frontal sinus with scattered opacification of the
right anterior ethmoidal air cells. The remaining paranasal sinuses
and mastoid air cells appear well aerated. No air-fluid levels.

Other: Regional soft tissues appear normal.
IMPRESSION: 1. Negative noncontrast head CT.
2. Mild sinus disease as above.  No air-fluid levels.
